# Patient Record
Sex: Male | Born: 2000 | Race: Black or African American | Hispanic: No | Marital: Single | Smoking: Never smoker
Health system: Southern US, Community
[De-identification: ages and names within clinical notes are randomized; demographics above are authoritative.]

---
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
## 2018-03-19 ENCOUNTER — Other Ambulatory Visit: Payer: Self-pay

## 2018-03-19 ENCOUNTER — Emergency Department (HOSPITAL_COMMUNITY)
Admission: EM | Admit: 2018-03-19 | Discharge: 2018-03-20 | Disposition: A | Discharge status: Home / Self Care | Payer: BLUE CROSS/BLUE SHIELD | Attending: Emergency Medicine | Admitting: Emergency Medicine

## 2018-03-19 ENCOUNTER — Encounter (HOSPITAL_COMMUNITY): Payer: Self-pay

## 2018-03-19 DIAGNOSIS — S01511A Laceration without foreign body of lip, initial encounter: Secondary | ICD-10-CM | POA: Diagnosis present

## 2018-03-19 DIAGNOSIS — Y9289 Other specified places as the place of occurrence of the external cause: Secondary | ICD-10-CM | POA: Diagnosis not present

## 2018-03-19 DIAGNOSIS — Y9372 Activity, wrestling: Secondary | ICD-10-CM | POA: Diagnosis not present

## 2018-03-19 DIAGNOSIS — W51XXXA Accidental striking against or bumped into by another person, initial encounter: Secondary | ICD-10-CM | POA: Insufficient documentation

## 2018-03-19 DIAGNOSIS — Y998 Other external cause status: Secondary | ICD-10-CM | POA: Diagnosis not present

## 2018-03-19 NOTE — ED Triage Notes (Signed)
Pt reports that he was wrestling with his friends and accidentally cut his lip. Pt reports  3/10 ache. Pt has scant bleeding to inner lower lip and is controlled. Pt escorted with a friend.

## 2018-03-20 NOTE — ED Provider Notes (Signed)
[facility]-EMERGENCY DEPT Provider Note   CSN: [id number] Arrival date & time: 03/19/18  2158    History   Chief Complaint Chief Complaint  Patient presents with  . Lip Laceration    HPI Jeffery Lewis is a 17 y.o. male.   17 year old male presents to the emergency department for evaluation of lower lip laceration.  He states that he was wrestling with his friend when his tooth bit down into his lower lip.  He complains of an aching discomfort rated 3/10.  Has had scant bleeding which is controlled with pressure.  Injury occurred at approximately 2000 tonight.  He has not taken any medications for his symptoms.  No difficulty with jaw opening or swallowing.  Immunizations including Tdap up-to-date, per patient.     History reviewed. No pertinent past medical history.  There are no active problems to display for this patient.   History reviewed. No pertinent surgical history.      Home Medications    Prior to Admission medications   Not on File    Family History History reviewed. No pertinent family history.  Social History Social History   Tobacco Use  . Smoking status: Never Smoker  . Smokeless tobacco: Never Used  Substance Use Topics  . Alcohol use: No    Frequency: Never  . Drug use: No     Allergies   Patient has no known allergies.   Review of Systems Review of Systems Ten systems reviewed and are negative for acute change, except as noted in the HPI.    Physical Exam Updated Vital Signs BP 122/72   Pulse 65   Temp 98.2 F (36.8 C) (Oral)   Resp 18   Ht 6\' 2"  (1.88 m)   Wt 74.8 kg   SpO2 100%   BMI 21.18 kg/m   Physical Exam  Constitutional: He is oriented to person, place, and time. He appears well-developed and well-nourished. No distress.  Nontoxic appearing and in NAD  HENT:  Head: Normocephalic and atraumatic.  Maceration to the inner lower lip without active bleeding. No injury to external  lip, vermilion border, chin. Dentition intact. Symmetric jaw opening without malocclusion.  Eyes: Conjunctivae and EOM are normal. No scleral icterus.  Neck: Normal range of motion.  Pulmonary/Chest: Effort normal. No stridor. No respiratory distress.  Respirations even and unlabored  Musculoskeletal: Normal range of motion.  Neurological: He is alert and oriented to person, place, and time. He exhibits normal muscle tone. Coordination normal.  Skin: Skin is warm and dry. No rash noted. He is not diaphoretic. No erythema. No pallor.  Psychiatric: He has a normal mood and affect. His behavior is normal.  Nursing note and vitals reviewed.    ED Treatments / Results  Labs (all labs ordered are listed, but only abnormal results are displayed) Labs Reviewed - No data to display  EKG None  Radiology No results found.  Procedures Procedures (including critical care time)  Medications Ordered in ED Medications - No data to display   Initial Impression / Assessment and Plan / ED Course  I have reviewed the triage vital signs and the nursing notes.  Pertinent labs & imaging results that were available during my care of the patient were reviewed by me and considered in my medical decision making (see chart for details).     17 year old male presents to the emergency department for evaluation of laceration to the inner lower lip.  No indication for suturing.  No  active bleeding on assessment.  Pt has no comorbidities to effect normal wound healing. Discussed home care with patient and answered questions. Patient to follow up with PCP for wound check PRN. Return precautions discussed and provided. Patient discharged in stable condition with no unaddressed concerns.   Final Clinical Impressions(s) / ED Diagnoses   Final diagnoses:  Laceration of lower lip, initial encounter    ED Discharge Orders    None       Antony Madura, PA-C 03/20/18 0057    Paula Libra, MD 03/20/18  [phone]

## 2018-03-20 NOTE — Discharge Instructions (Signed)
We recommend Tylenol or ibuprofen for pain.  Swish with warm water following meals to ensure that no particles become lodged in your wound.  Your laceration will heal without stitches.  This should be well-healed within 1 week.  If you have mild bleeding, apply constant pressure for 10 minutes.  Icing may also constrict the vessels in the area to help slow and stop bleeding.  Avoid biting into foods or using straws for 1 week.  You may return for new or concerning symptoms.

## 2019-05-25 IMAGING — DX DG CHEST 2V
2 series · 2 of 2 positions shown · non-contrast
Comparison: None.

CLINICAL DATA: 16-year-old male with symptoms for 1 month.

EXAM:
CHEST  2 VIEW

[chest pa]
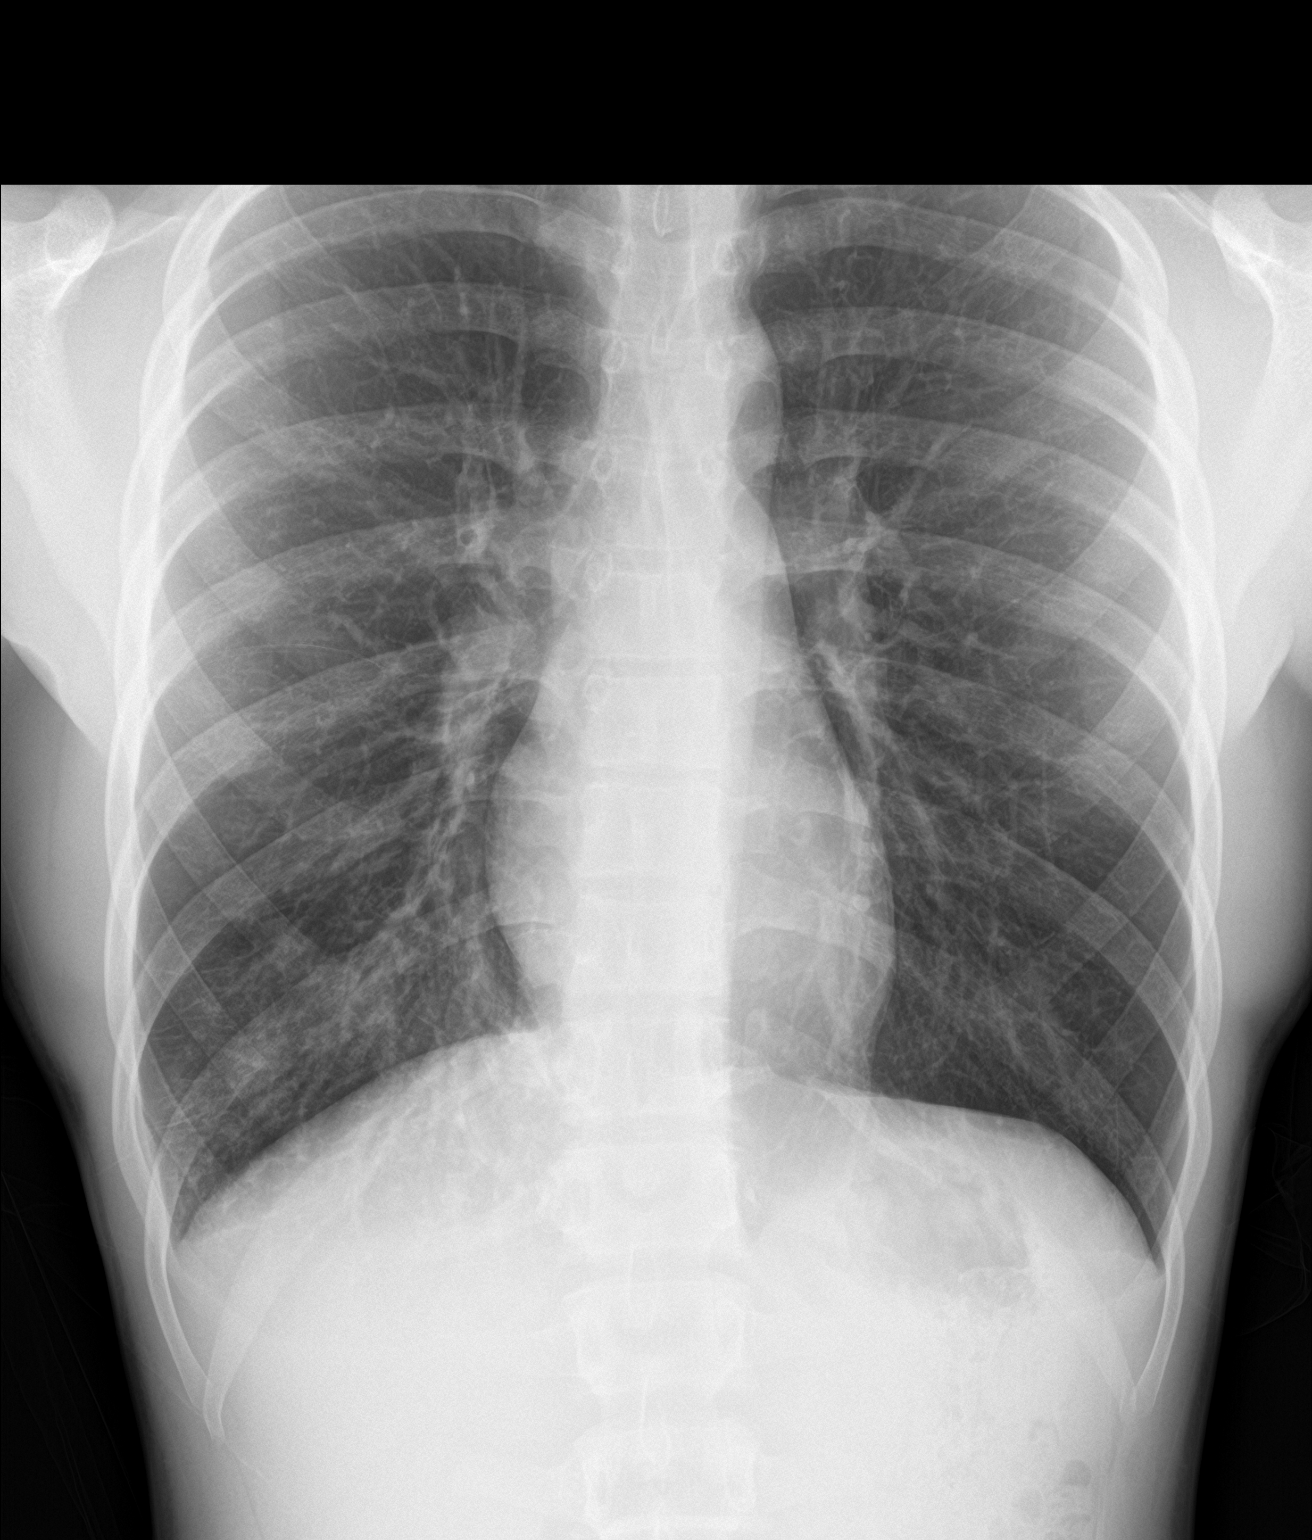

[chest lat]
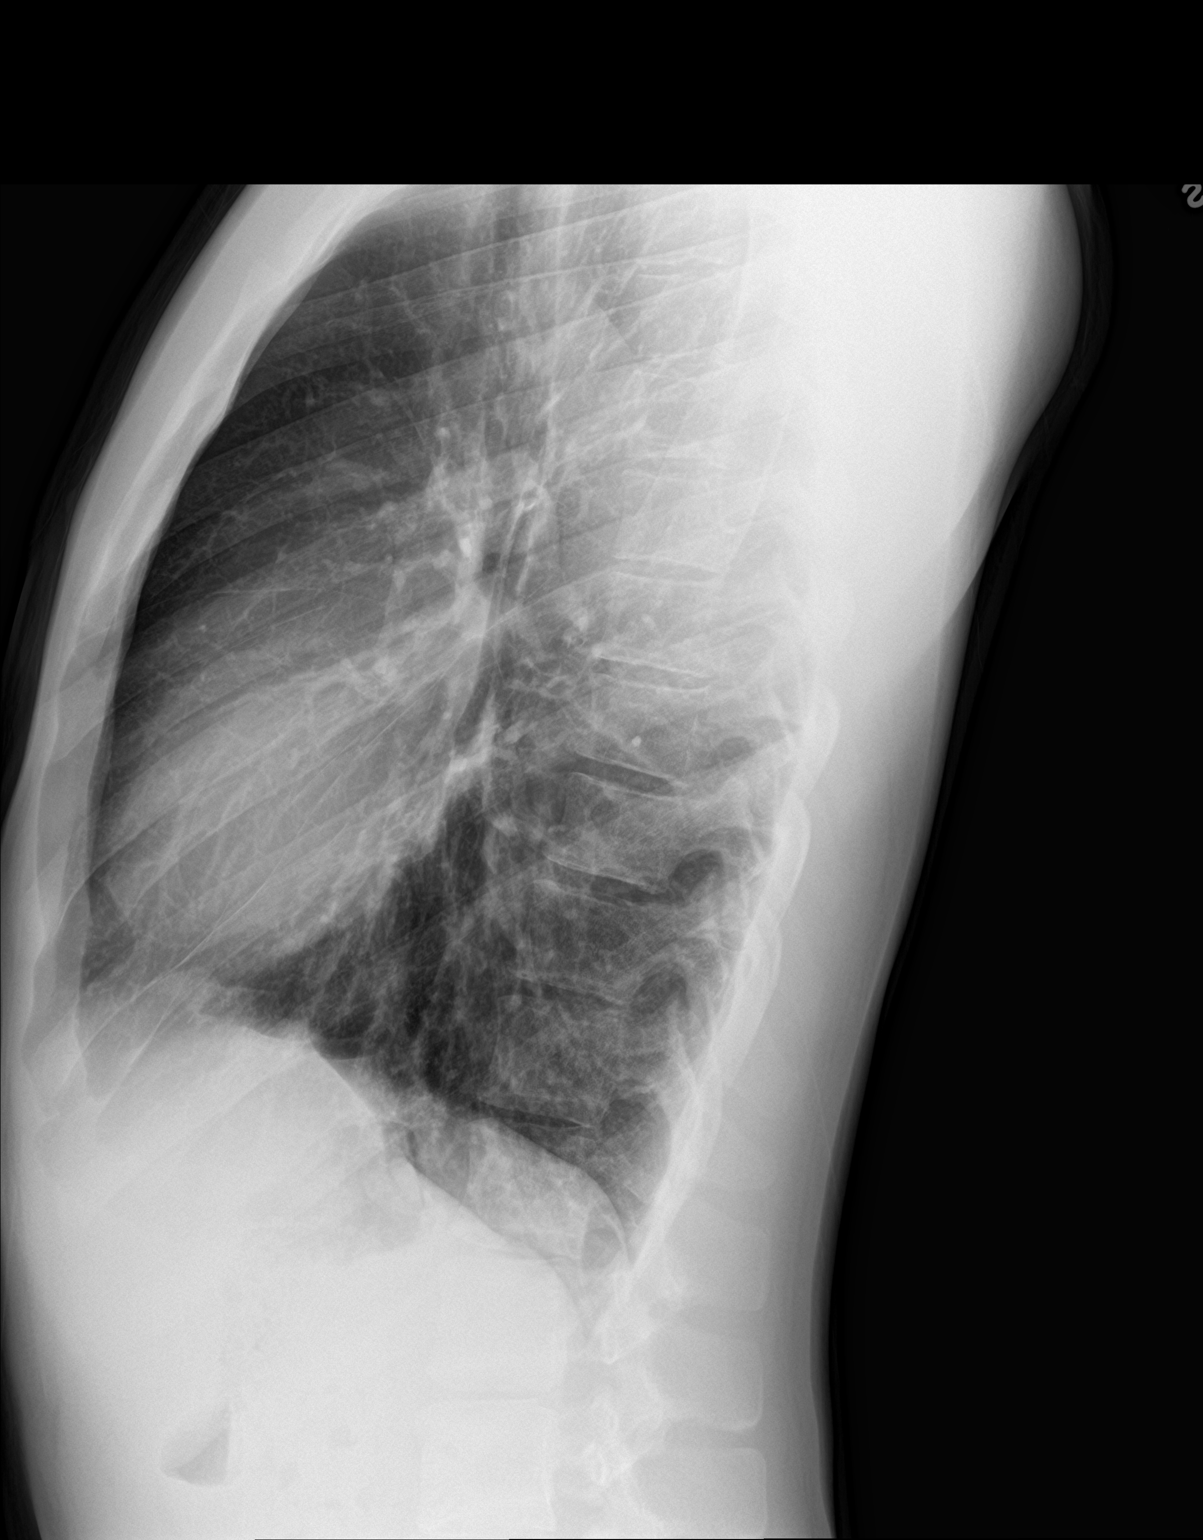

[2 of 2 positions shown; findings below may reference images not displayed]

FINDINGS: The heart size and mediastinal contours are within normal limits.
Both lungs are clear. The visualized skeletal structures are
unremarkable.
IMPRESSION: No active cardiopulmonary disease.
